# Patient Record
Sex: Female | Born: 1935 | Hispanic: No | Smoking: Never smoker
Health system: Southern US, Community
[De-identification: ages and names within clinical notes are randomized; demographics above are authoritative.]

## PROBLEM LIST (undated history)

## (undated) DIAGNOSIS — E7800 Pure hypercholesterolemia, unspecified: Secondary | ICD-10-CM

## (undated) DIAGNOSIS — F0390 Unspecified dementia without behavioral disturbance: Secondary | ICD-10-CM

## (undated) DIAGNOSIS — I10 Essential (primary) hypertension: Secondary | ICD-10-CM

---

## 2016-12-05 ENCOUNTER — Emergency Department
Admission: EM | Admit: 2016-12-05 | Discharge: 2016-12-05 | Disposition: A | Discharge status: Home / Self Care | Payer: Medicare Other | Attending: Emergency Medicine | Admitting: Emergency Medicine

## 2016-12-05 ENCOUNTER — Emergency Department: Payer: Medicare Other

## 2016-12-05 ENCOUNTER — Encounter: Payer: Self-pay | Admitting: Emergency Medicine

## 2016-12-05 DIAGNOSIS — I10 Essential (primary) hypertension: Secondary | ICD-10-CM | POA: Insufficient documentation

## 2016-12-05 DIAGNOSIS — R1030 Lower abdominal pain, unspecified: Secondary | ICD-10-CM | POA: Diagnosis not present

## 2016-12-05 DIAGNOSIS — Z79899 Other long term (current) drug therapy: Secondary | ICD-10-CM | POA: Diagnosis not present

## 2016-12-05 DIAGNOSIS — R300 Dysuria: Secondary | ICD-10-CM | POA: Insufficient documentation

## 2016-12-05 HISTORY — DX: Unspecified dementia, unspecified severity, without behavioral disturbance, psychotic disturbance, mood disturbance, and anxiety: F03.90

## 2016-12-05 HISTORY — DX: Essential (primary) hypertension: I10

## 2016-12-05 HISTORY — DX: Pure hypercholesterolemia, unspecified: E78.00

## 2016-12-05 LAB — CBC WITH DIFFERENTIAL/PLATELET
BASOS ABS: 0 10*3/uL (ref 0–0.1)
BASOS PCT: 1 %
Eosinophils Absolute: 0.1 10*3/uL (ref 0–0.7)
Eosinophils Relative: 2 %
HEMATOCRIT: 34.6 % — AB (ref 35.0–47.0)
HEMOGLOBIN: 11.7 g/dL — AB (ref 12.0–16.0)
Lymphocytes Relative: 24 %
Lymphs Abs: 1.1 10*3/uL (ref 1.0–3.6)
MCH: 26.5 pg (ref 26.0–34.0)
MCHC: 33.7 g/dL (ref 32.0–36.0)
MCV: 78.8 fL — ABNORMAL LOW (ref 80.0–100.0)
MONOS PCT: 5 %
Monocytes Absolute: 0.2 10*3/uL (ref 0.2–0.9)
NEUTROS PCT: 68 %
Neutro Abs: 3.1 10*3/uL (ref 1.4–6.5)
Platelets: 161 10*3/uL (ref 150–440)
RBC: 4.39 MIL/uL (ref 3.80–5.20)
RDW: 13.4 % (ref 11.5–14.5)
WBC: 4.5 10*3/uL (ref 3.6–11.0)

## 2016-12-05 LAB — URINALYSIS, ROUTINE W REFLEX MICROSCOPIC
BACTERIA UA: NONE SEEN
BILIRUBIN URINE: NEGATIVE
Glucose, UA: NEGATIVE mg/dL
HGB URINE DIPSTICK: NEGATIVE
Ketones, ur: NEGATIVE mg/dL
LEUKOCYTES UA: NEGATIVE
NITRITE: NEGATIVE
Protein, ur: 100 mg/dL — AB
Specific Gravity, Urine: 1.014 (ref 1.005–1.030)
pH: 6 (ref 5.0–8.0)

## 2016-12-05 LAB — COMPREHENSIVE METABOLIC PANEL
ALK PHOS: 58 U/L (ref 38–126)
ALT: 11 U/L — ABNORMAL LOW (ref 14–54)
ANION GAP: 6 (ref 5–15)
AST: 20 U/L (ref 15–41)
Albumin: 3.8 g/dL (ref 3.5–5.0)
BUN: 16 mg/dL (ref 6–20)
CALCIUM: 8.5 mg/dL — AB (ref 8.9–10.3)
CO2: 25 mmol/L (ref 22–32)
Chloride: 109 mmol/L (ref 101–111)
Creatinine, Ser: 1.14 mg/dL — ABNORMAL HIGH (ref 0.44–1.00)
GFR calc Af Amer: 48 mL/min — ABNORMAL LOW (ref >60)
GFR calc non Af Amer: 41 mL/min — ABNORMAL LOW (ref >60)
Glucose, Bld: 192 mg/dL — ABNORMAL HIGH (ref 65–99)
POTASSIUM: 3.7 mmol/L (ref 3.5–5.1)
SODIUM: 140 mmol/L (ref 135–145)
TOTAL PROTEIN: 7.2 g/dL (ref 6.5–8.1)
Total Bilirubin: 0.7 mg/dL (ref 0.3–1.2)

## 2016-12-05 LAB — TROPONIN I

## 2016-12-05 LAB — LIPASE, BLOOD: Lipase: 38 U/L (ref 11–51)

## 2016-12-05 LAB — LACTIC ACID, PLASMA: Lactic Acid, Venous: 0.9 mmol/L (ref 0.5–1.9)

## 2016-12-05 MED ORDER — ONDANSETRON HCL 4 MG/2ML IJ SOLN
4.0000 mg | Freq: Once | INTRAMUSCULAR | Status: AC
  Administered 2016-12-05: 4 mg via INTRAVENOUS
  Filled 2016-12-05: qty 2

## 2016-12-05 MED ORDER — MORPHINE SULFATE (PF) 4 MG/ML IV SOLN
4.0000 mg | Freq: Once | INTRAVENOUS | Status: AC
  Administered 2016-12-05: 4 mg via INTRAVENOUS
  Filled 2016-12-05: qty 1

## 2016-12-05 MED ORDER — IOPAMIDOL (ISOVUE-300) INJECTION 61%
75.0000 mL | Freq: Once | INTRAVENOUS | Status: AC | PRN
  Administered 2016-12-05: 75 mL via INTRAVENOUS

## 2016-12-05 NOTE — ED Notes (Signed)
Patient back from CT.

## 2016-12-05 NOTE — ED Notes (Signed)
Niece reports patient has dementia and is at her baseline

## 2016-12-05 NOTE — ED Triage Notes (Addendum)
Presents with family with dysuria and some abd discomfort since last pm  She is visiting from out of town  Family thinks she may have UTI

## 2016-12-05 NOTE — Discharge Instructions (Signed)
Fortunately today your blood work your urine test and your CT scan were very normal and you do not need antibiotics or surgery. Please make an appointment to follow-up with primary care in the next week or 2 as needed. Return to the emergency department sooner for any new or worsening symptoms such as worsening pain, fevers, chills, if you cannot eat or drink, or for any other concerns whatsoever.  It was a pleasure to take care of you today, and thank you for coming to our emergency department.  If you have any questions or concerns before leaving please ask the nurse to grab me and I'm more than happy to go through your aftercare instructions again.  If you were prescribed any opioid pain medication today such as Norco, Vicodin, Percocet, morphine, hydrocodone, or oxycodone please make sure you do not drive when you are taking this medication as it can alter your ability to drive safely.  If you have any concerns once you are home that you are not improving or are in fact getting worse before you can make it to your follow-up appointment, please do not hesitate to call 911 and come back for further evaluation.  Merrily Brittle, MD  Results for orders placed or performed during the hospital encounter of 12/05/16  Comprehensive metabolic panel  Result Value Ref Range   Sodium 140 135 - 145 mmol/L   Potassium 3.7 3.5 - 5.1 mmol/L   Chloride 109 101 - 111 mmol/L   CO2 25 22 - 32 mmol/L   Glucose, Bld 192 (H) 65 - 99 mg/dL   BUN 16 6 - 20 mg/dL   Creatinine, Ser 1.61 (H) 0.44 - 1.00 mg/dL   Calcium 8.5 (L) 8.9 - 10.3 mg/dL   Total Protein 7.2 6.5 - 8.1 g/dL   Albumin 3.8 3.5 - 5.0 g/dL   AST 20 15 - 41 U/L   ALT 11 (L) 14 - 54 U/L   Alkaline Phosphatase 58 38 - 126 U/L   Total Bilirubin 0.7 0.3 - 1.2 mg/dL   GFR calc non Af Amer 41 (L) >60 mL/min   GFR calc Af Amer 48 (L) >60 mL/min   Anion gap 6 5 - 15  CBC with Diff  Result Value Ref Range   WBC 4.5 3.6 - 11.0 K/uL   RBC 4.39 3.80 -  5.20 MIL/uL   Hemoglobin 11.7 (L) 12.0 - 16.0 g/dL   HCT 09.6 (L) 04.5 - 40.9 %   MCV 78.8 (L) 80.0 - 100.0 fL   MCH 26.5 26.0 - 34.0 pg   MCHC 33.7 32.0 - 36.0 g/dL   RDW 81.1 91.4 - 78.2 %   Platelets 161 150 - 440 K/uL   Neutrophils Relative % 68 %   Neutro Abs 3.1 1.4 - 6.5 K/uL   Lymphocytes Relative 24 %   Lymphs Abs 1.1 1.0 - 3.6 K/uL   Monocytes Relative 5 %   Monocytes Absolute 0.2 0.2 - 0.9 K/uL   Eosinophils Relative 2 %   Eosinophils Absolute 0.1 0 - 0.7 K/uL   Basophils Relative 1 %   Basophils Absolute 0.0 0 - 0.1 K/uL  Urinalysis, Routine w reflex microscopic  Result Value Ref Range   Color, Urine YELLOW (A) YELLOW   APPearance HAZY (A) CLEAR   Specific Gravity, Urine 1.014 1.005 - 1.030   pH 6.0 5.0 - 8.0   Glucose, UA NEGATIVE NEGATIVE mg/dL   Hgb urine dipstick NEGATIVE NEGATIVE   Bilirubin Urine NEGATIVE NEGATIVE  Ketones, ur NEGATIVE NEGATIVE mg/dL   Protein, ur 794 (A) NEGATIVE mg/dL   Nitrite NEGATIVE NEGATIVE   Leukocytes, UA NEGATIVE NEGATIVE   RBC / HPF 0-5 0 - 5 RBC/hpf   WBC, UA 0-5 0 - 5 WBC/hpf   Bacteria, UA NONE SEEN NONE SEEN   Squamous Epithelial / LPF 6-30 (A) NONE SEEN   Mucous PRESENT   Lactic acid, plasma  Result Value Ref Range   Lactic Acid, Venous 0.9 0.5 - 1.9 mmol/L  Lipase, blood  Result Value Ref Range   Lipase 38 11 - 51 U/L  Troponin I  Result Value Ref Range   Troponin I <0.03 <0.03 ng/mL   Ct Abdomen Pelvis W Contrast  Result Date: 12/05/2016 CLINICAL DATA:  Abdominal infection, abdominal discomfort since yesterday. EXAM: CT ABDOMEN AND PELVIS WITH CONTRAST TECHNIQUE: Multidetector CT imaging of the abdomen and pelvis was performed using the standard protocol following bolus administration of intravenous contrast. CONTRAST:  23mL ISOVUE-300 IOPAMIDOL (ISOVUE-300) INJECTION 61% COMPARISON:  None. FINDINGS: Lower chest: Left basilar atelectasis.  Cardiomegaly. Hepatobiliary: No focal hepatic mass. Mild intrahepatic biliary  duct dilatation and dilated common bile duct with tapering at the level of the pancreatic head. Prior cholecystectomy. Pancreas: Unremarkable. No pancreatic ductal dilatation or surrounding inflammatory changes. Spleen: Normal in size without focal abnormality. Adrenals/Urinary Tract: Normal adrenal glands. 17 mm hypodense, fluid attenuating right anterior interpolar renal mass most consistent with a cyst. 12 mm nonobstructing right renal calculus. No ureteral calculus. No obstructive uropathy. Mild relative bladder wall thickening which may be secondary to underdistention versus cystitis. Stomach/Bowel: No bowel dilatation to suggest obstruction. No bowel wall thickening. No pneumatosis, pneumoperitoneum or portal venous gas. Normal appendix. Sigmoid diverticulosis without evidence of diverticulitis. No abdominal or pelvic free fluid. Vascular/Lymphatic: No lymphadenopathy. Abdominal aortic atherosclerosis. Atherosclerotic plaque at the origin of bilateral renal arteries. Atherosclerotic plaque at the origin of the celiac artery and superior mesenteric artery. Reproductive: Uterus and bilateral adnexa are unremarkable. Other: No abdominal wall hernia or abnormality. No abdominopelvic ascites. Musculoskeletal: No acute osseous abnormality. No lytic or sclerotic osseous lesion. Degenerative disc disease with disc height loss at T7-8, T8-9, T9-10, T10-11, T11-12, L2-3 and L4-5. Diffuse bilateral facet arthropathy throughout the lumbar spine. IMPRESSION: 1. Nonobstructing 12 mm right renal calculus. 2. Mild relative bladder wall thickening which may be secondary to underdistention versus cystitis. 3. Mild intrahepatic biliary duct dilatation and dilated common bile duct with tapering at the level of the pancreatic head. This is likely secondary to post cholecystectomy state. 4. Diverticulosis without evidence of diverticulitis. 5.  Aortic Atherosclerosis (ICD10-170.0) Electronically Signed   By: Elige Ko   On:  12/05/2016 14:25

## 2016-12-05 NOTE — ED Provider Notes (Signed)
[facility] Emergency Department Provider Note  ____________________________________________   First MD Initiated Contact with Patient 12/05/16 1143     (approximate)  I have reviewed the triage vital signs and the nursing notes.   HISTORY  Chief Complaint Dysuria  History obtained with the use of family members as [place] translators  HPI Kristi Cook is a 83 y.o. female who comes to the emergency department with roughly 24 hours of lower abdominal discomfort dysuria and "hot sensation" when urinating. She has a past medical history of dementia, hypertension, and dyslipidemia. She has a past surgical history of cholecystectomy. Patient lives in [place] and came to visit family yesterday.   Past Medical History:  Diagnosis Date  . Dementia   . Hypercholesteremia   . Hypertension     There are no active problems to display for this patient.   History reviewed. No pertinent surgical history.  Prior to Admission medications   Medication Sig Start Date End Date Taking? Authorizing Provider  alendronate (FOSAMAX) 70 MG tablet Take 70 mg by mouth every 7 (seven) days. 11/03/16  Yes [provider]  donepezil (ARICEPT) 5 MG tablet Take 5 mg by mouth daily. 11/03/16  Yes [provider]  omeprazole (PRILOSEC) 40 MG capsule Take 40 mg by mouth daily. 11/03/16  Yes [provider]  simvastatin (ZOCOR) 20 MG tablet Take 20 mg by mouth daily. 11/03/16  Yes [provider]  valsartan (DIOVAN) 160 MG tablet Take 160 mg by mouth daily. 11/03/16  Yes [provider]    Allergies Patient has no known allergies.  No family history on file.  Social History Social History  Substance Use Topics  . Smoking status: Never Smoker  . Smokeless tobacco: Never Used  . Alcohol use No    Review of Systems Constitutional: No fever/chills Eyes: No visual changes. ENT: No sore throat. Cardiovascular: Denies chest  pain. Respiratory: Denies shortness of breath. Gastrointestinal: positive abdominal pain.  No nausea, no vomiting.  No diarrhea.  No constipation. Genitourinary: positive for dysuria. Musculoskeletal: Negative for back pain. Skin: Negative for rash. Neurological: Negative for headaches, focal weakness or numbness.   ____________________________________________   PHYSICAL EXAM:  VITAL SIGNS: ED Triage Vitals  Enc Vitals Group     BP 12/05/16 1023 (!) 182/58     Pulse Rate 12/05/16 1023 60     Resp 12/05/16 1023 18     Temp 12/05/16 1023 98.6 F (37 C)     Temp Source 12/05/16 1023 Oral     SpO2 12/05/16 1023 96 %     Weight 12/05/16 1020 129 lb (58.5 kg)     Height 12/05/16 1020 5\' 1"  (1.549 m)     Head Circumference --      Peak Flow --      Pain Score 12/05/16 1020 6     Pain Loc --      Pain Edu? --      Excl. in GC? --     Constitutional: pleasant cooperative well-appearing Eyes: PERRL EOMI. Head: Atraumatic. Nose: No congestion/rhinnorhea. Mouth/Throat: No trismus Neck: No stridor.   Cardiovascular: Normal rate, regular rhythm. Grossly normal heart sounds.  Good peripheral circulation. Respiratory: Normal respiratory effort.  No retractions. Lungs CTAB and moving good air Gastrointestinal: soft nondistended diffuse lower abdominal tenderness with mild uarding but no frank peritonitis Musculoskeletal: No lower extremity edema   Neurologic:  Normal speech and language. No gross focal neurologic deficits are appreciated. Skin:  Skin is  warm, dry and intact. No rash noted. Psychiatric: Mood and affect are normal. Speech and behavior are normal.    ____________________________________________   DIFFERENTIAL includes but not limited to  Appendicitis, diverticulitis, small bowel stricture, large bowel exertion, volvulus, renal colic, pyelonephritis ____________________________________________   LABS (all labs ordered are listed, but only abnormal results are  displayed)  Labs Reviewed  COMPREHENSIVE METABOLIC PANEL - Abnormal; Notable for the following:       Result Value   Glucose, Bld 192 (*)    Creatinine, Ser 1.14 (*)    Calcium 8.5 (*)    ALT 11 (*)    GFR calc non Af Amer 41 (*)    GFR calc Af Amer 48 (*)    All other components within normal limits  CBC WITH DIFFERENTIAL/PLATELET - Abnormal; Notable for the following:    Hemoglobin 11.7 (*)    HCT 34.6 (*)    MCV 78.8 (*)    All other components within normal limits  URINALYSIS, ROUTINE W REFLEX MICROSCOPIC - Abnormal; Notable for the following:    Color, Urine YELLOW (*)    APPearance HAZY (*)    Protein, ur 100 (*)    Squamous Epithelial / LPF 6-30 (*)    All other components within normal limits  LACTIC ACID, PLASMA  LIPASE, BLOOD  TROPONIN I    Urinalysis with no evidence of infection __________________________________________  EKG  ED ECG REPORT I, Merrily Brittle, the attending physician, personally viewed and interpreted this ECG.  Date: 12/05/2016 EKG Time:  Rate: 51 Rhythm: sinus bradycardia QRS Axis: normal Intervals: normal ST/T Wave abnormalities: normal Narrative Interpretation: abnormal  ____________________________________________  RADIOLOGY  CT scan with large right-sided kidney stone although not in the ureter and no CT explanation for the patient's pain ____________________________________________   PROCEDURES  Procedure(s) performed: no  Procedures  Critical Care performed: no  Observation: no ____________________________________________   INITIAL IMPRESSION / ASSESSMENT AND PLAN / ED COURSE  Pertinent labs & imaging results that were available during my care of the patient were reviewed by me and considered in my medical decision making (see chart for details).  The patient arrives somewhat uncomfortable appearing with tender lower abdomen and normal urinalysis. Differential is broad but at this point she requires addition of  a troponin, EKG, as well as a CT scan of her abdomen pelvis.     Fortunately the patient's CT scan is negative for acute surgical or medical pathology. She feels improved and was able to eat. Given reassurance and she is medically stable for outpatient management. ____________________________________________   FINAL CLINICAL IMPRESSION(S) / ED DIAGNOSES  Final diagnoses:  Dysuria      NEW MEDICATIONS STARTED DURING THIS VISIT:  Discharge Medication List as of 12/05/2016  2:48 PM       Note:  This document was prepared using Dragon voice recognition software and may include unintentional dictation errors.     Merrily Brittle, MD 12/08/16 [id number]

## 2016-12-05 NOTE — ED Triage Notes (Addendum)
First Nurse Note:  AAOx3.  Skin warm and dry. NAD.   

## 2018-04-17 IMAGING — CT CT ABD-PELV W/ CM
2 of 5 series · 15 of 46 positions shown, 17 images · IV contrast (APPLIED)
Comparison: None.

CLINICAL DATA: Abdominal infection, abdominal discomfort since
yesterday.

EXAM:
CT ABDOMEN AND PELVIS WITH CONTRAST
TECHNIQUE: Multidetector CT imaging of the abdomen and pelvis was performed
using the standard protocol following bolus administration of
intravenous contrast.
CONTRAST:  75mL [id number] IOPAMIDOL (VVN6I5-2DD) INJECTION 61%

[Series 2: routine abd/pel with · axial · 0.74mm/px · z∈[-960,-570]mm · 12 of 88 slices shown, 14 images]
[im 5/88  soft-tissue]
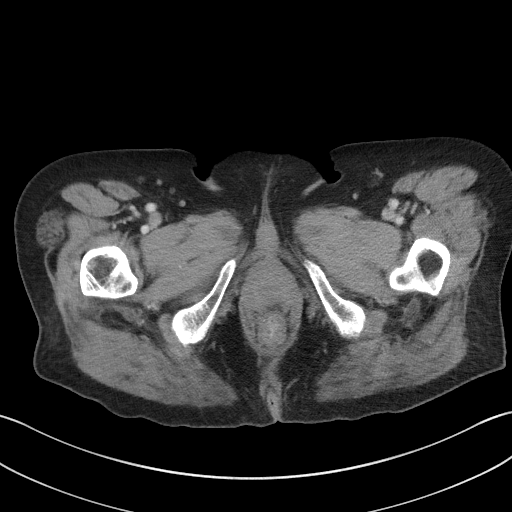
[im 5/88  bone]
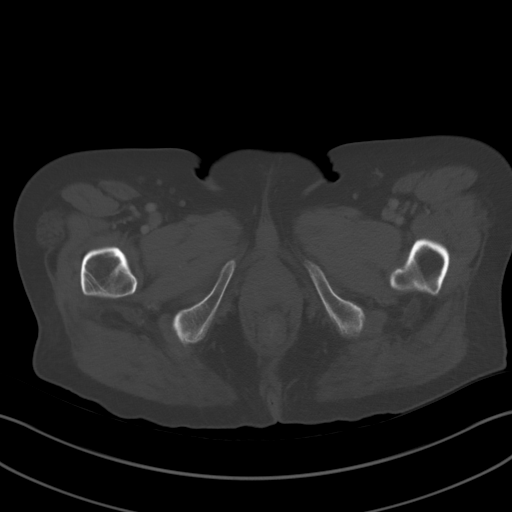
[im 14/88  soft-tissue]
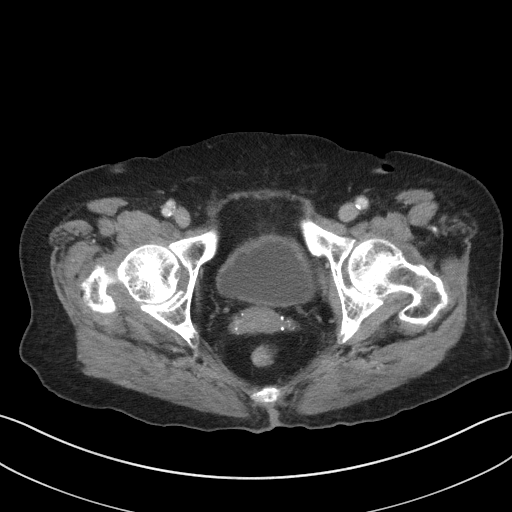
[im 19/88  soft-tissue]
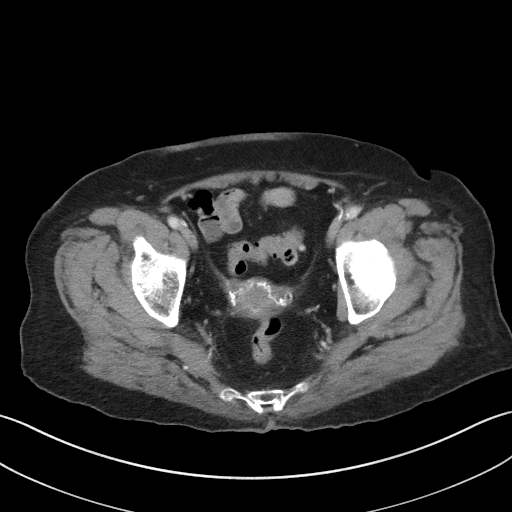
[im 28/88  soft-tissue]
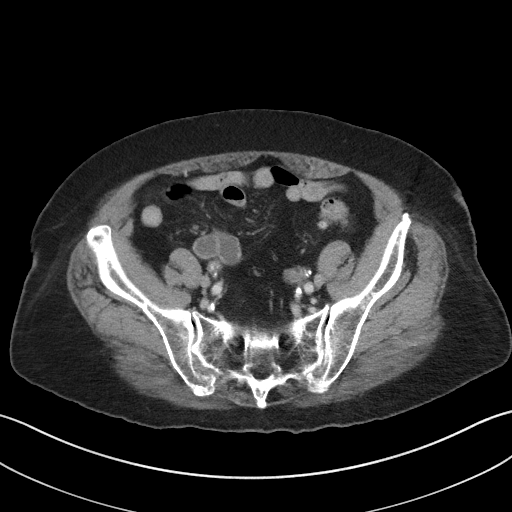
[im 33/88  soft-tissue]
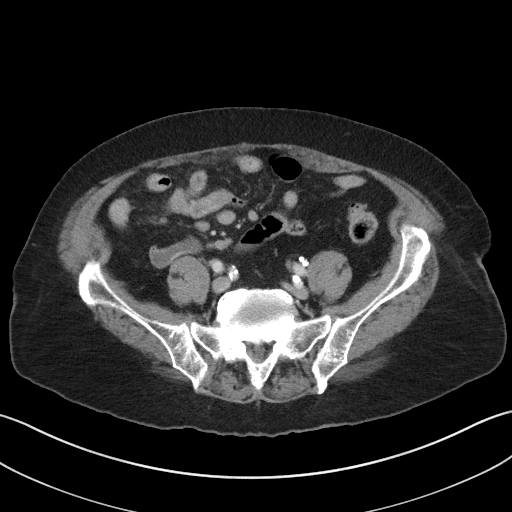
[im 42/88  soft-tissue]
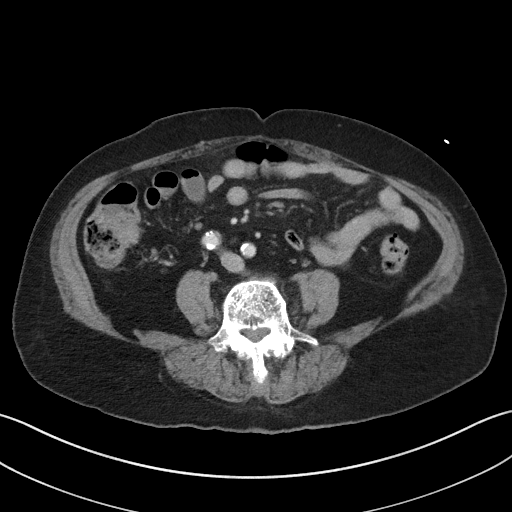
[im 46/88  soft-tissue]
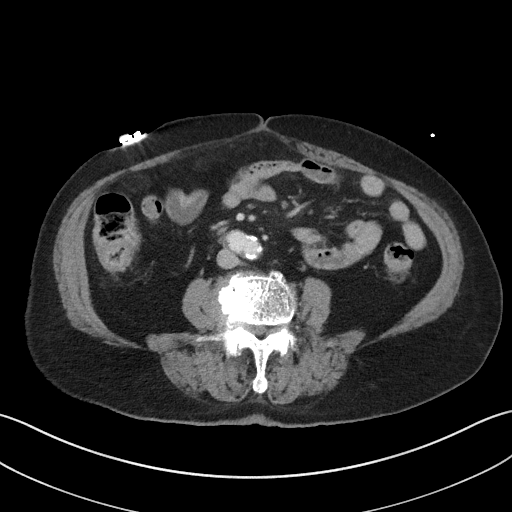
[im 55/88  soft-tissue]
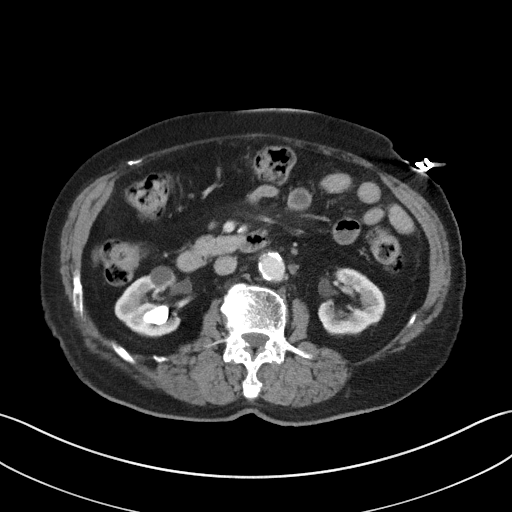
[im 60/88  soft-tissue]
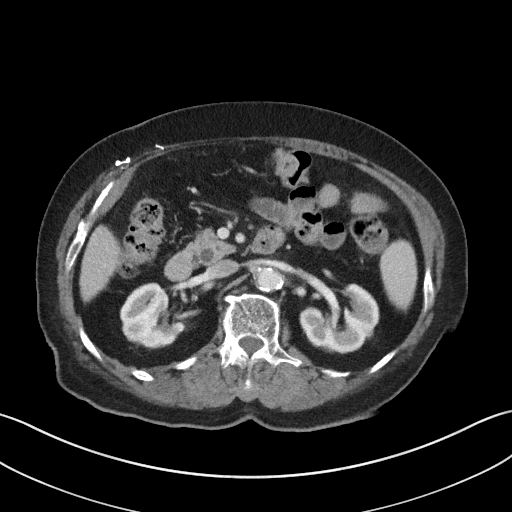
[im 60/88  bone]
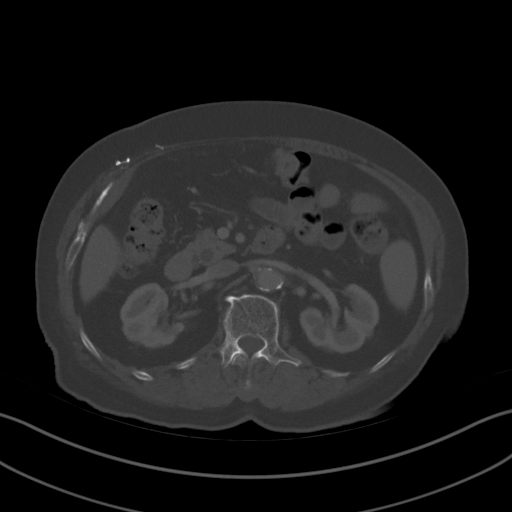
[im 69/88  soft-tissue]
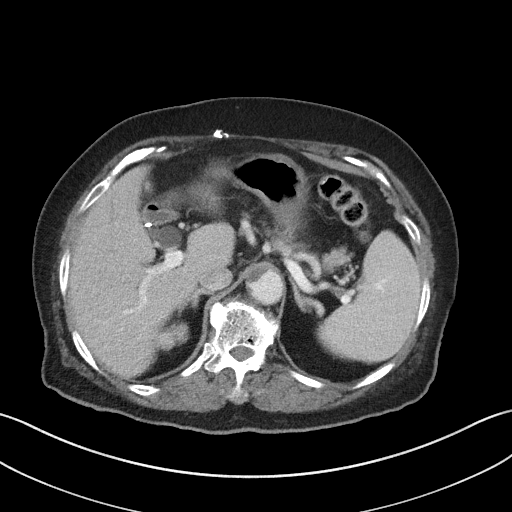
[im 74/88  soft-tissue]
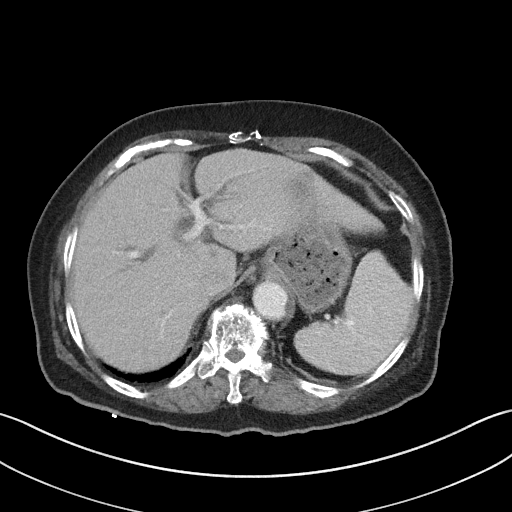
[im 83/88  soft-tissue]
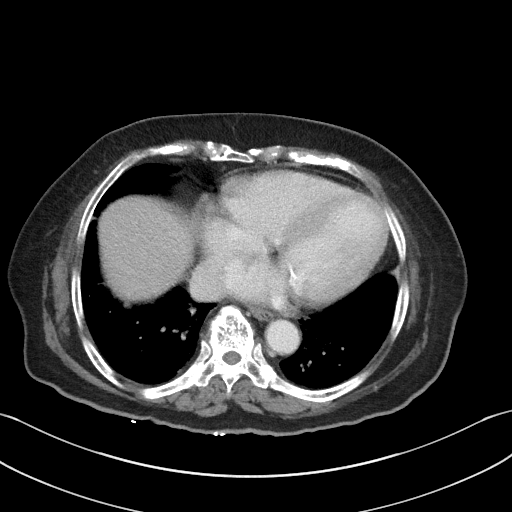

[Series 5: coronal st · coronal · 0.66mm/px · 3 of 78 slices shown]
[im 26/78  soft-tissue]
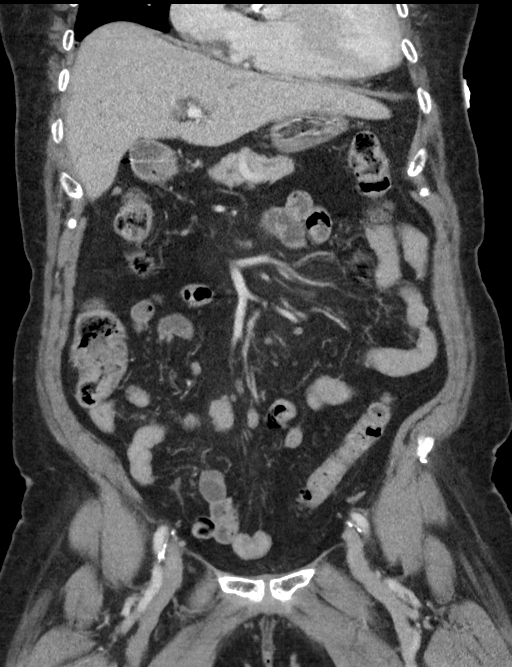
[im 35/78  soft-tissue]
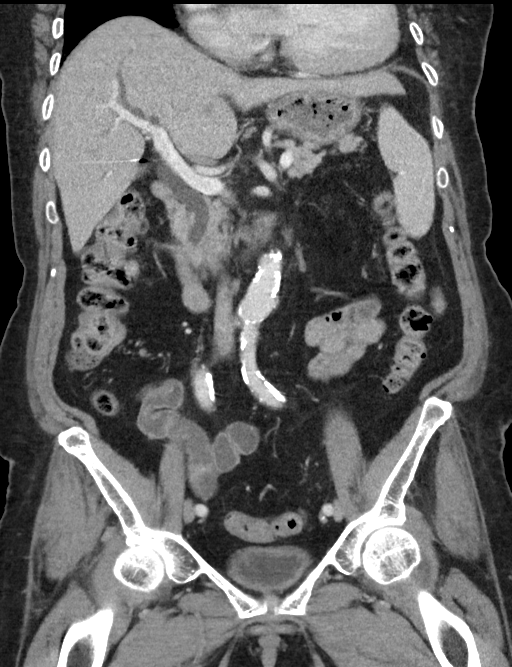
[im 43/78  soft-tissue]
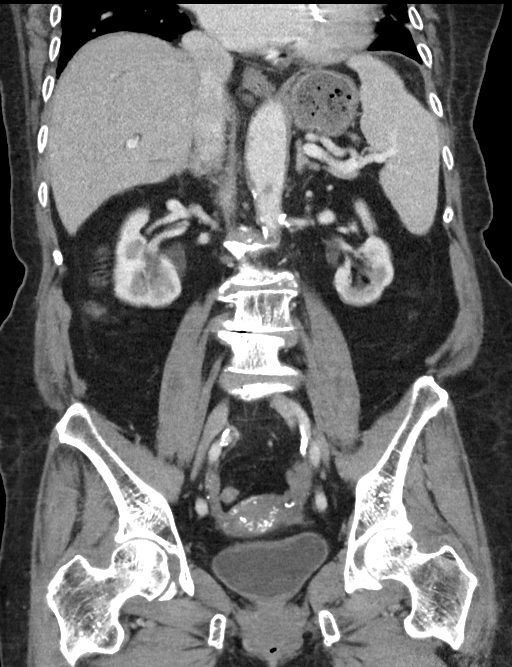

[15 of 46 positions shown; findings below may reference images not displayed]

FINDINGS: Lower chest: Left basilar atelectasis.  Cardiomegaly.

Hepatobiliary: No focal hepatic mass. Mild intrahepatic biliary duct
dilatation and dilated common bile duct with tapering at the level
of the pancreatic head. Prior cholecystectomy.

Pancreas: Unremarkable. No pancreatic ductal dilatation or
surrounding inflammatory changes.

Spleen: Normal in size without focal abnormality.

Adrenals/Urinary Tract: Normal adrenal glands. 17 mm hypodense,
fluid attenuating right anterior interpolar renal mass most
consistent with a cyst. 12 mm nonobstructing right renal calculus.
No ureteral calculus. No obstructive uropathy. Mild relative bladder
wall thickening which may be secondary to underdistention versus
cystitis.

Stomach/Bowel: No bowel dilatation to suggest obstruction. No bowel
wall thickening. No pneumatosis, pneumoperitoneum or portal venous
gas. Normal appendix. Sigmoid diverticulosis without evidence of
diverticulitis. No abdominal or pelvic free fluid.

Vascular/Lymphatic: No lymphadenopathy. Abdominal aortic
atherosclerosis. Atherosclerotic plaque at the origin of bilateral
renal arteries. Atherosclerotic plaque at the origin of the celiac
artery and superior mesenteric artery.

Reproductive: Uterus and bilateral adnexa are unremarkable.

Other: No abdominal wall hernia or abnormality. No abdominopelvic
ascites.

Musculoskeletal: No acute osseous abnormality. No lytic or sclerotic
osseous lesion. Degenerative disc disease with disc height loss at
T7-8, T8-9, T9-10, T10-11, T11-12, L2-3 and L4-5. Diffuse bilateral
facet arthropathy throughout the lumbar spine.
IMPRESSION: 1. Nonobstructing 12 mm right renal calculus.
2. Mild relative bladder wall thickening which may be secondary to
underdistention versus cystitis.
3. Mild intrahepatic biliary duct dilatation and dilated common bile
duct with tapering at the level of the pancreatic head. This is
likely secondary to post cholecystectomy state.
4. Diverticulosis without evidence of diverticulitis.
5.  Aortic Atherosclerosis (RAPKU-170.0)
